# Patient Record
Sex: Male | Born: 1980 | Race: Black or African American | Hispanic: No | Marital: Single | Smoking: Current every day smoker
Health system: Southern US, Community
[De-identification: ages and names within clinical notes are randomized; demographics above are authoritative.]

## PROBLEM LIST (undated history)

## (undated) DIAGNOSIS — E119 Type 2 diabetes mellitus without complications: Secondary | ICD-10-CM

---

## 2005-03-19 ENCOUNTER — Emergency Department: Payer: Self-pay | Admitting: Emergency Medicine

## 2006-07-12 ENCOUNTER — Emergency Department: Payer: Self-pay | Admitting: Emergency Medicine

## 2006-07-13 ENCOUNTER — Other Ambulatory Visit: Payer: Self-pay

## 2006-10-09 ENCOUNTER — Emergency Department: Payer: Self-pay | Admitting: Emergency Medicine

## 2007-05-07 ENCOUNTER — Emergency Department: Payer: Self-pay | Admitting: Unknown Physician Specialty

## 2007-05-07 ENCOUNTER — Other Ambulatory Visit: Payer: Self-pay

## 2007-07-21 ENCOUNTER — Emergency Department: Payer: Self-pay | Admitting: Emergency Medicine

## 2007-07-21 ENCOUNTER — Other Ambulatory Visit: Payer: Self-pay

## 2007-09-24 ENCOUNTER — Ambulatory Visit: Payer: Self-pay | Admitting: Internal Medicine

## 2007-09-24 ENCOUNTER — Other Ambulatory Visit: Payer: Self-pay

## 2008-09-12 IMAGING — CR DG CHEST 2V
1 series · 2 of 2 positions shown · non-contrast
Comparison: none

REASON FOR EXAM: chest pain
COMMENTS:

PROCEDURE:     MDR - MDR CHEST PA(OR AP) AND LATERAL  - September 24, 2007  [DATE]
RESULT:     No acute cardiopulmonary disease is noted.

[Series 1: view not recorded · 0.17mm/px · 2 of 2 slices shown]
[im 1/2]
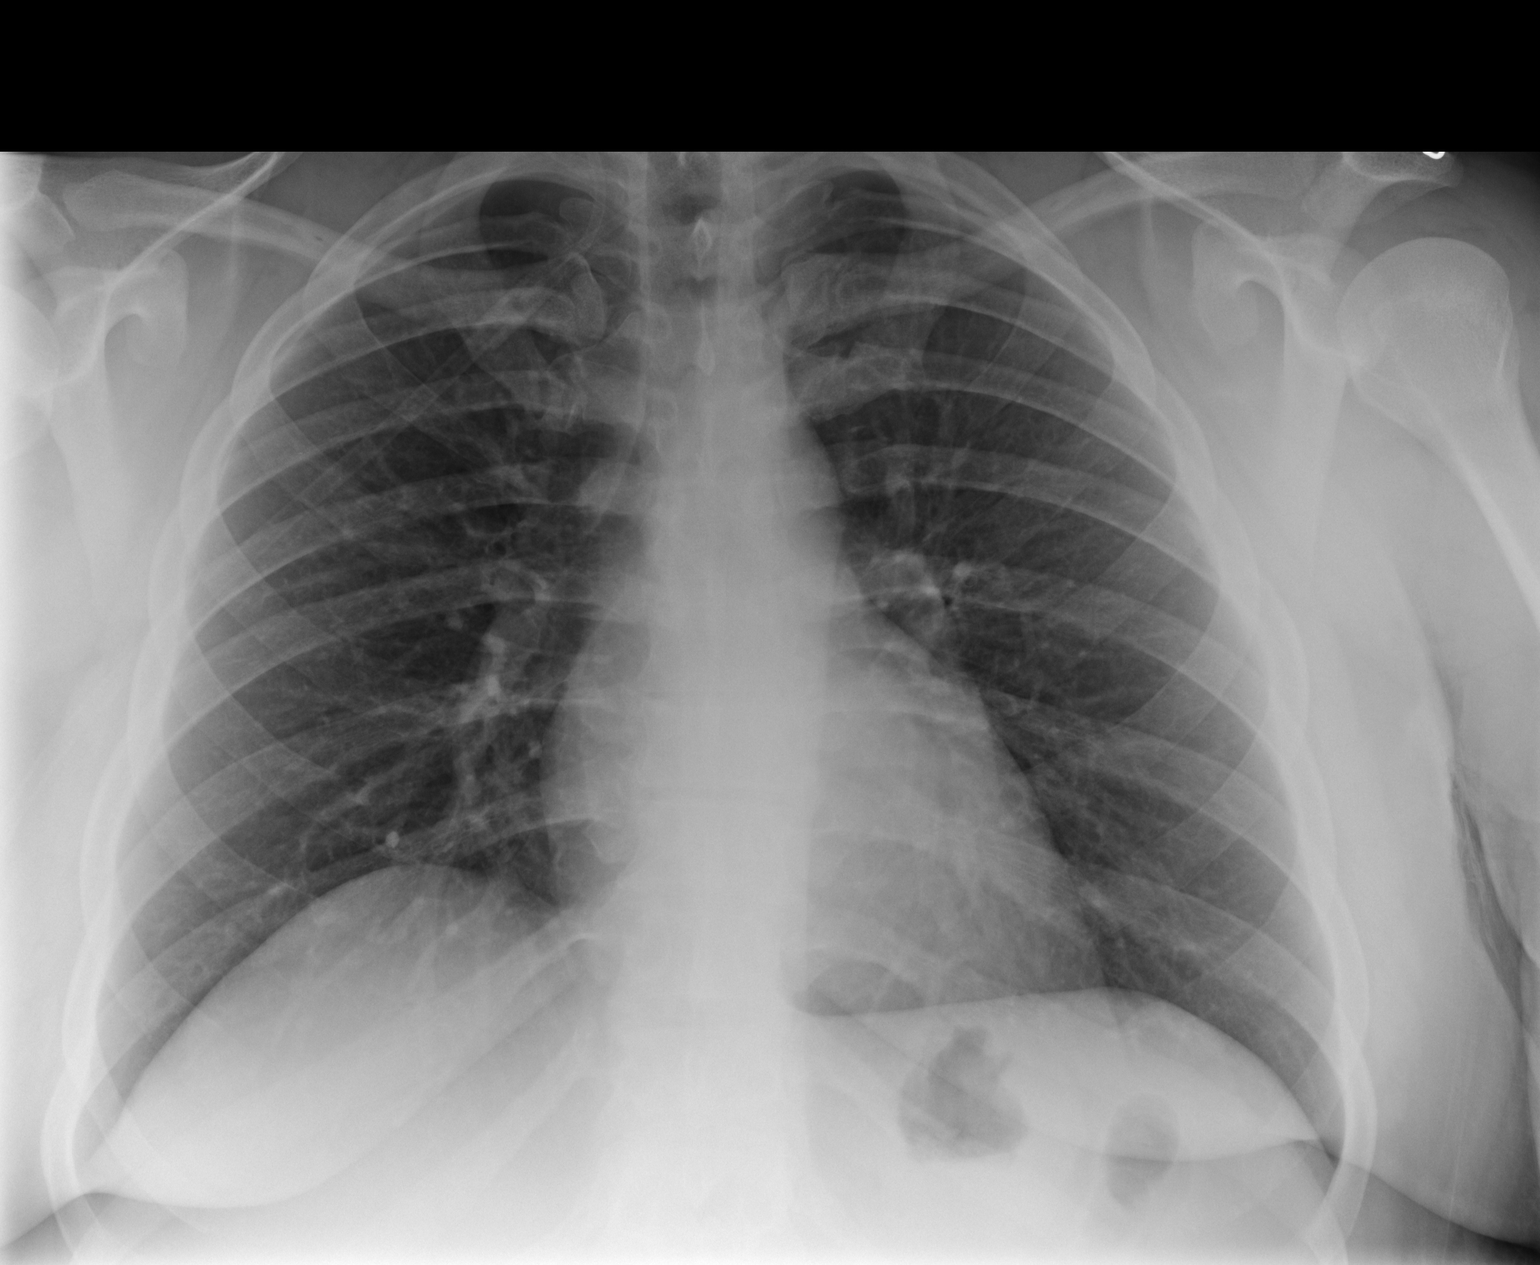
[im 2/2]
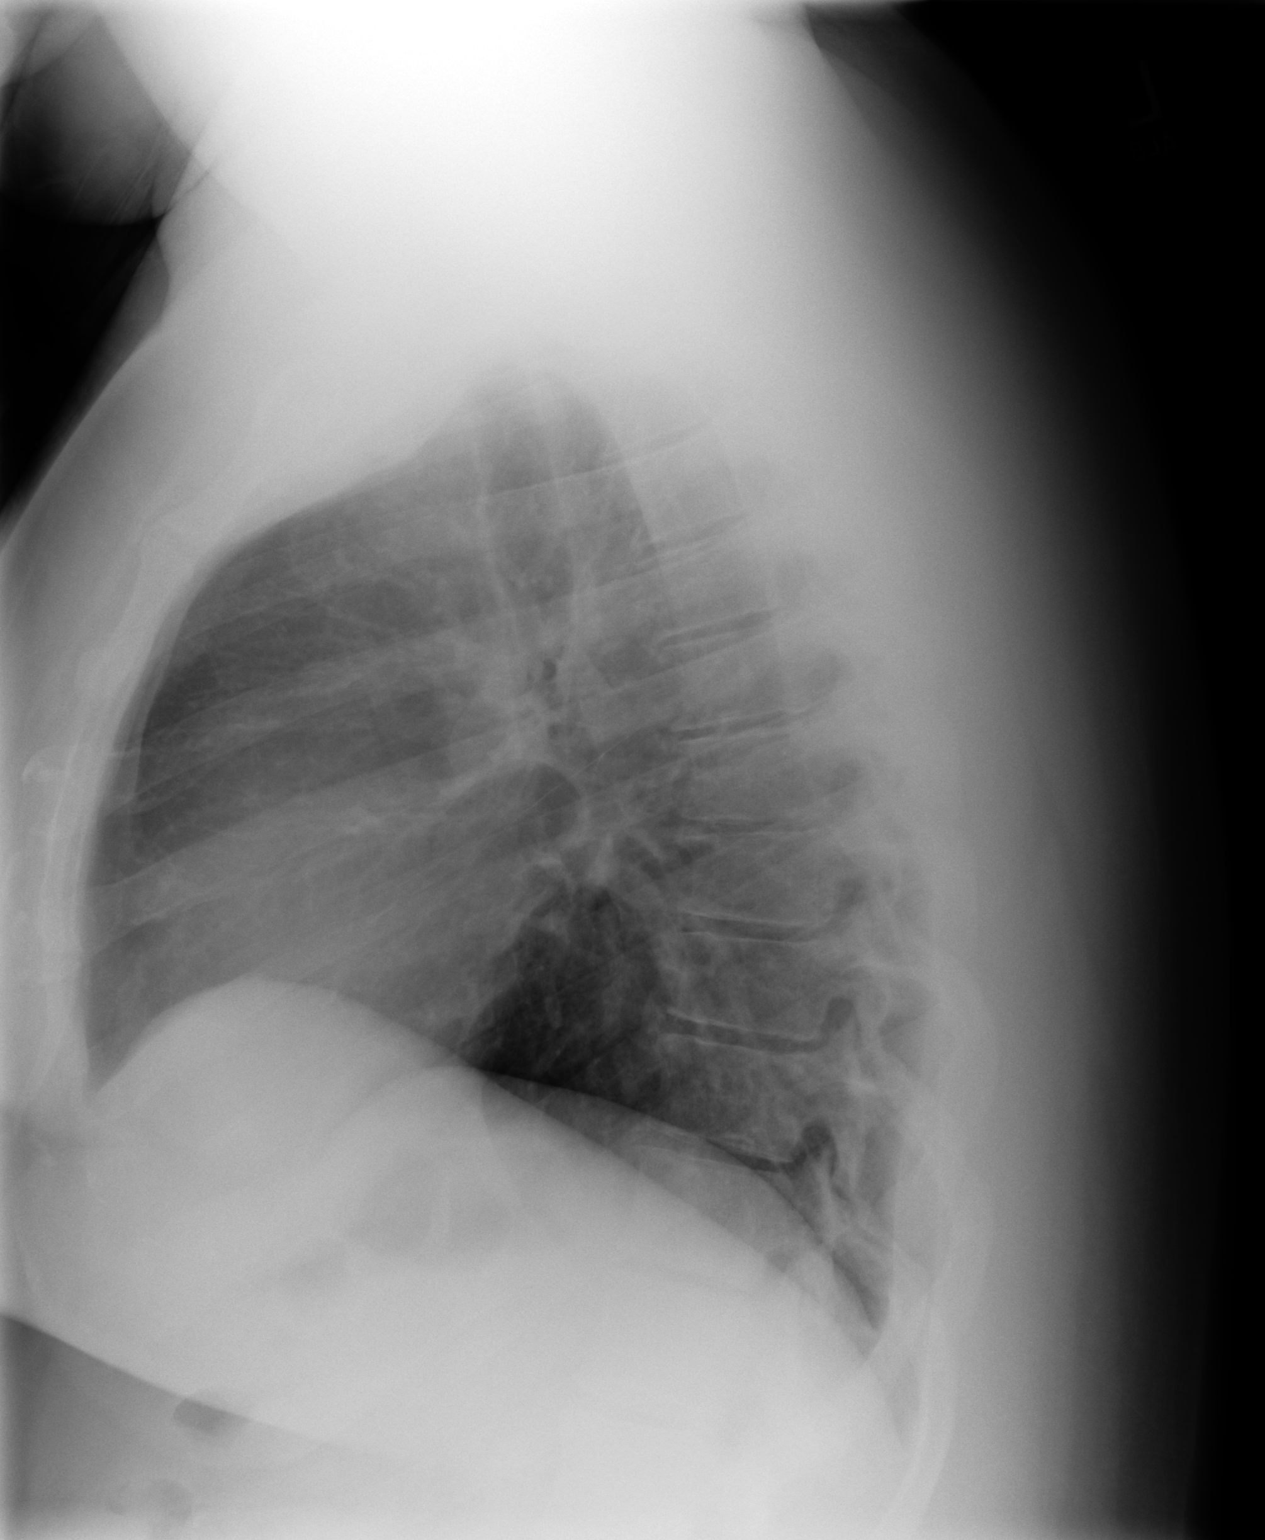

[2 of 2 positions shown; findings below may reference images not displayed]

IMPRESSION: No acute cardiopulmonary disease.

## 2008-12-27 ENCOUNTER — Emergency Department: Payer: Self-pay | Admitting: Emergency Medicine

## 2010-06-11 ENCOUNTER — Emergency Department: Payer: Self-pay | Admitting: Emergency Medicine

## 2015-02-24 ENCOUNTER — Encounter: Payer: Self-pay | Admitting: *Deleted

## 2015-02-24 ENCOUNTER — Emergency Department
Admission: EM | Admit: 2015-02-24 | Discharge: 2015-02-24 | Disposition: A | Discharge status: Home / Self Care | Payer: BLUE CROSS/BLUE SHIELD | Attending: Student | Admitting: Student

## 2015-02-24 ENCOUNTER — Emergency Department: Payer: BLUE CROSS/BLUE SHIELD

## 2015-02-24 DIAGNOSIS — M940 Chondrocostal junction syndrome [Tietze]: Secondary | ICD-10-CM | POA: Insufficient documentation

## 2015-02-24 DIAGNOSIS — R079 Chest pain, unspecified: Secondary | ICD-10-CM | POA: Diagnosis present

## 2015-02-24 LAB — CBC
HEMATOCRIT: 42.2 % (ref 40.0–52.0)
HEMOGLOBIN: 14.3 g/dL (ref 13.0–18.0)
MCH: 29.7 pg (ref 26.0–34.0)
MCHC: 33.8 g/dL (ref 32.0–36.0)
MCV: 87.9 fL (ref 80.0–100.0)
Platelets: 242 10*3/uL (ref 150–440)
RBC: 4.8 MIL/uL (ref 4.40–5.90)
RDW: 13.9 % (ref 11.5–14.5)
WBC: 7.3 10*3/uL (ref 3.8–10.6)

## 2015-02-24 LAB — BASIC METABOLIC PANEL
ANION GAP: 8 (ref 5–15)
BUN: 12 mg/dL (ref 6–20)
CHLORIDE: 103 mmol/L (ref 101–111)
CO2: 25 mmol/L (ref 22–32)
Calcium: 9.1 mg/dL (ref 8.9–10.3)
Creatinine, Ser: 1.24 mg/dL (ref 0.61–1.24)
GFR calc non Af Amer: >60 mL/min (ref >60)
Glucose, Bld: 222 mg/dL — ABNORMAL HIGH (ref 65–99)
POTASSIUM: 3.4 mmol/L — AB (ref 3.5–5.1)
SODIUM: 136 mmol/L (ref 135–145)

## 2015-02-24 LAB — FIBRIN DERIVATIVES D-DIMER (ARMC ONLY): Fibrin derivatives D-dimer (ARMC): 451 (ref 0–499)

## 2015-02-24 LAB — TROPONIN I: Troponin I: <0.03 ng/mL (ref <0.031)

## 2015-02-24 MED ORDER — KETOROLAC TROMETHAMINE 30 MG/ML IJ SOLN
30.0000 mg | Freq: Once | INTRAMUSCULAR | Status: AC
  Administered 2015-02-24: 30 mg via INTRAVENOUS
  Filled 2015-02-24: qty 1

## 2015-02-24 MED ORDER — OXYCODONE-ACETAMINOPHEN 5-325 MG PO TABS
2.0000 | ORAL_TABLET | Freq: Once | ORAL | Status: AC
  Administered 2015-02-24: 2 via ORAL
  Filled 2015-02-24: qty 2

## 2015-02-24 MED ORDER — SODIUM CHLORIDE 0.9 % IV BOLUS (SEPSIS)
1000.0000 mL | Freq: Once | INTRAVENOUS | Status: AC
  Administered 2015-02-24: 1000 mL via INTRAVENOUS

## 2015-02-24 MED ORDER — IBUPROFEN 600 MG PO TABS: 600.0000 mg | ORAL_TABLET | Freq: Three times a day (TID) | ORAL | Status: AC | PRN

## 2015-02-24 NOTE — ED Notes (Signed)
Presents via family with intermittent chest pain since last weds.

## 2015-02-24 NOTE — ED Notes (Signed)
Patient transported to X-ray 

## 2015-02-24 NOTE — ED Notes (Signed)
States right sided chest pain with SOB for 1 week, states tenderness to touch, pt states he was told he had an enlarged heart but never followed up with cardiology, states the pain gets better when he holds his chest or lies on his back

## 2015-02-24 NOTE — ED Provider Notes (Signed)
[facility] Emergency Department Provider Note  ____________________________________________  Time seen: Approximately 6:11 PM  I have reviewed the triage vital signs and the nursing notes.   HISTORY  Chief Complaint Chest Pain    HPI James Valentine is a 35 y.o. male with history of diabetes who presents for evaluation of 9 days constant right anterior chest wall pain, gradual onset, constant since onset, currently moderate, worse with deep inspiration and movement and associated with mild shortness of breath. Pain is not worse with exertion. Pain is not ripping or tearing in nature, does not radiate towards the feet or towards the back. The patient reports that initially he thought it was secondary to a pulled muscle. He has no history of coronary artery disease. He does have a family history of early coronary artery disease. No personal or family history of DVT or PE. No family history of sudden cardiac death. He has had cough but no fever.   History reviewed. No pertinent past medical history.  There are no active problems to display for this patient.   No past surgical history on file.  No current outpatient prescriptions on file.  Allergies Review of patient's allergies indicates no known allergies.  History reviewed. No pertinent family history.  Social History Social History  Substance Use Topics  . Smoking status: None  . Smokeless tobacco: None  . Alcohol Use: None    Review of Systems Constitutional: No fever/chills Eyes: No visual changes. ENT: No sore throat. Cardiovascular: + chest pain. Respiratory: + shortness of breath. Gastrointestinal: No abdominal pain.  No nausea, no vomiting.  No diarrhea.  No constipation. Genitourinary: Negative for dysuria. Musculoskeletal: Negative for back pain. Skin: Negative for rash. Neurological: Negative for headaches, focal weakness or numbness.  10-point ROS otherwise  negative.  ____________________________________________   PHYSICAL EXAM:  VITAL SIGNS: ED Triage Vitals  Enc Vitals Group     BP 02/24/15 1748 144/90 mmHg     Pulse Rate 02/24/15 1748 130     Resp 02/24/15 1748 18     Temp 02/24/15 1748 98.6 F (37 C)     Temp Source 02/24/15 1748 Oral     SpO2 02/24/15 1748 100 %     Weight 02/24/15 1748 257 lb (116.574 kg)     Height 02/24/15 1748  (1.803 m)     Head Cir --      Peak Flow --      Pain Score 02/24/15 1751 6     Pain Loc --      Pain Edu? --      Excl. in GC? --     Constitutional: Alert and oriented. Well appearing and in no acute distress. Eyes: Conjunctivae are normal. PERRL. EOMI. Head: Atraumatic. Nose: No congestion/rhinnorhea. Mouth/Throat: Mucous membranes are moist.  Oropharynx non-erythematous. Neck: No stridor.   Cardiovascular: Normal rate, regular rhythm. Grossly normal heart sounds.  Good peripheral circulation. Respiratory: Normal respiratory effort.  No retractions. Lungs CTAB. Gastrointestinal: Soft and nontender. No distention.  No CVA tenderness. Genitourinary: deferred Musculoskeletal: No lower extremity tenderness nor edema.  No joint effusions. The patient has reproducible right anterior chest wall point tenderness to palpation. Neurologic:  Normal speech and language. No gross focal neurologic deficits are appreciated. No gait instability. Skin:  Skin is warm, dry and intact. No rash noted. Psychiatric: Mood and affect are normal. Speech and behavior are normal.  __________________________________[facility] Ltd[facility]dered are listed, but only abnormal results are displayed)  Labs Reviewed  BASIC METABOLIC PANEL - Abnormal; Notable for the following:    Potassium 3.4 (*)    Glucose, Bld 222 (*)    All other components within normal limits  CBC  TROPONIN I  FIBRIN DERIVATIVES D-DIMER ([facility] ONLY)   ____________________________________________  EKG  ED ECG REPORT I,  Gayla Doss, the attending physician, personally viewed and interpreted this ECG.   Date: 02/24/2015  EKG Time: 17:46  Rate: 128  Rhythm: Sinus tachycardia  Axis: normal  Intervals:none  ST&T Change: No acute ST elevation. Q-wave in V3.  ____________________________________________  RADIOLOGY  CXR IMPRESSION: No acute cardiopulmonary disease.  ____________________________________________   PROCEDURES  Procedure(s) performed: None  Critical Care performed: No  ____________________________________________   INITIAL IMPRESSION / ASSESSMENT AND PLAN / ED COURSE  Pertinent labs & imaging results that were available during my care of the patient were reviewed by me and considered in my medical decision making (see chart for details).  James Valentine is a 35 y.o. male with history of diabetes who presents for evaluation of 9 days constant right anterior chest wall pain, gradual onset, constant since onset, currently moderate, worse with deep inspiration and movement and associated with mild shortness of breath. On exam, he is generally well-appearing and in no acute distress. He was noted to be severely tachycardic and his triage assessment however this has essentially resolved at the time of my evaluation however will give 1 L normal saline. He has reproducible chest wall tenderness to palpation suspect his pain is likely muscle skeletal in nature, possibly costochondritis. His pain and obtain screening cardiac labs as well as d-dimer. Reassess for disposition.  ----------------------------------------- 8:15 PM on 02/24/2015 -----------------------------------------  Patient with continued resolution of tachycardia. Pain slightly improved at this time. EKG not consistent with acute ischemia. Troponin negative. Heart score 2, doubt ACS. D-dimer is not elevated, doubt PE. Clinically not consistent with acute aortic dissection. Chest x-ray clear. Normal CBC and BMP. Discussed return  precautions, need for close PCP follow-up and the patient is comfortable with the discharge plan. DC home with NSAIDs. ____________________________________________   FINAL CLINICAL IMPRESSION(S) / ED DIAGNOSES  Final diagnoses:  Chest pain, unspecified chest pain type  Costochondritis, acute      Gayla Doss, MD 02/24/15 2016

## 2015-03-08 ENCOUNTER — Ambulatory Visit
Admission: EM | Admit: 2015-03-08 | Discharge: 2015-03-08 | Disposition: A | Discharge status: Home / Self Care | Payer: BLUE CROSS/BLUE SHIELD | Attending: Family Medicine | Admitting: Family Medicine

## 2015-03-08 ENCOUNTER — Encounter: Payer: Self-pay | Admitting: Emergency Medicine

## 2015-03-08 DIAGNOSIS — H6502 Acute serous otitis media, left ear: Secondary | ICD-10-CM | POA: Diagnosis not present

## 2015-03-08 HISTORY — DX: Type 2 diabetes mellitus without complications: E11.9

## 2015-03-08 LAB — RAPID STREP SCREEN (MED CTR MEBANE ONLY): Streptococcus, Group A Screen (Direct): NEGATIVE

## 2015-03-08 MED ORDER — AMOXICILLIN 875 MG PO TABS
875.0000 mg | ORAL_TABLET | Freq: Two times a day (BID) | ORAL | Status: AC
Start: 2015-03-08 — End: ?

## 2015-03-08 NOTE — ED Notes (Signed)
Patient c/o pain in his left ear and sore throat that started on Friday.  Patient denies fevers.

## 2015-03-08 NOTE — ED Provider Notes (Addendum)
CSN: [id number]     Arrival date & time 03/08/15  1800 History   First MD Initiated Contact with Patient 03/08/15 1847     Chief Complaint  Patient presents with  . Otalgia  . Sore Throat   (Consider location/radiation/quality/duration/timing/severity/associated sxs/prior Treatment) Patient is a 35 y.o. male presenting with ear pain and pharyngitis.  Otalgia Location:  Left Quality:  Aching and pressure Severity:  Moderate Onset quality:  Sudden Duration:  3 days Timing:  Constant Progression:  Worsening Chronicity:  New Relieved by:  None tried Ineffective treatments:  None tried Associated symptoms: congestion, cough and rhinorrhea   Associated symptoms: no abdominal pain, no ear discharge, no fever, no neck pain and no rash   Risk factors: no recent travel, no chronic ear infection and no prior ear surgery   Sore Throat Pertinent negatives include no abdominal pain.    Past Medical History  Diagnosis Date  . Diabetes mellitus without complication (HCC)    History reviewed. No pertinent past surgical history. History reviewed. No pertinent family history. Social History  Substance Use Topics  . Smoking status: Current Every Day Smoker    Types: Cigarettes  . Smokeless tobacco: None  . Alcohol Use: No    Review of Systems  Constitutional: Negative for fever.  HENT: Positive for congestion, ear pain and rhinorrhea. Negative for ear discharge.   Respiratory: Positive for cough.   Gastrointestinal: Negative for abdominal pain.  Musculoskeletal: Negative for neck pain.  Skin: Negative for rash.    Allergies  Review of patient's allergies indicates no known allergies.  Home Medications   Prior to Admission medications   Medication Sig Start Date End Date Taking? Authorizing Provider  amoxicillin (AMOXIL) 875 MG tablet Take 1 tablet (875 mg total) by mouth 2 (two) times daily. 03/08/15   Payton Mccallum, MD  ibuprofen (ADVIL,MOTRIN) 600 MG tablet Take 1 tablet (600  mg total) by mouth every 8 (eight) hours as needed for moderate pain. 02/24/15   Gayla Doss, MD   Meds Ordered and Administered this Visit  Medications - No data to display  BP 160/86 mmHg  Pulse 92  Temp(Src) 97.9 F (36.6 C) (Tympanic)  Resp 16  Ht 6' (1.829 m)  Wt 250 lb (113.399 kg)  BMI 33.90 kg/m2  SpO2 98% No data found.   Physical Exam  Constitutional: He appears well-developed and well-nourished. No distress.  HENT:  Head: Normocephalic and atraumatic.  Right Ear: Tympanic membrane, external ear and ear canal normal.  Left Ear: External ear and ear canal normal. Tympanic membrane is erythematous and bulging. A middle ear effusion is present.  Nose: Rhinorrhea present.  Mouth/Throat: Uvula is midline and mucous membranes are normal. Posterior oropharyngeal erythema present. No oropharyngeal exudate, posterior oropharyngeal edema or tonsillar abscesses.  Eyes: Conjunctivae and EOM are normal. Pupils are equal, round, and reactive to light. Right eye exhibits no discharge. Left eye exhibits no discharge. No scleral icterus.  Neck: Normal range of motion. Neck supple. No tracheal deviation present. No thyromegaly present.  Cardiovascular: Normal rate, regular rhythm and normal heart sounds.   Pulmonary/Chest: Effort normal and breath sounds normal. No stridor. No respiratory distress. He has no wheezes. He has no rales. He exhibits no tenderness.  Lymphadenopathy:    He has no cervical adenopathy.  Neurological: He is alert.  Skin: Skin is warm and dry. No rash noted. He is not diaphoretic.  Nursing note and vitals reviewed.   ED Course  Procedures (including critical  care time)  Labs Review Labs Reviewed  RAPID STREP SCREEN (NOT AT [facility])  CULTURE, GROUP A STREP [facility])    Imaging Review No results found.   Visual Acuity Review  Right Eye Distance:   Left Eye Distance:   Bilateral Distance:    Right Eye Near:   Left Eye Near:    Bilateral Near:          MDM   1. Acute serous otitis media of left ear, recurrence not specified    Discharge Medication List as of 03/08/2015  6:55 PM    START taking these medications   Details  amoxicillin (AMOXIL) 875 MG tablet Take 1 tablet (875 mg total) by mouth 2 (two) times daily., Starting 03/08/2015, Until Discontinued, Normal       1. diagnosis reviewed with patient 2. rx as per orders above; reviewed possible side effects, interactions, risks and benefits  3. Recommend supportive treatment with  4. Follow-up prn if symptoms worsen or don't improve    Payton Mccallum, MD 03/08/15 1858  Payton Mccallum, MD 03/08/15 1859

## 2015-03-12 ENCOUNTER — Telehealth: Payer: Self-pay

## 2015-03-12 LAB — CULTURE, GROUP A STREP (THRC)

## 2015-03-12 NOTE — ED Notes (Signed)
Called patient to advise + Group C result throat culture. No answer. Instructed to call and let MUC know if improving. Rx for Amoxicillin given day patient seen here

## 2015-04-05 ENCOUNTER — Ambulatory Visit
Admission: EM | Admit: 2015-04-05 | Discharge: 2015-04-05 | Disposition: A | Discharge status: Home / Self Care | Payer: BLUE CROSS/BLUE SHIELD | Attending: Family Medicine | Admitting: Family Medicine

## 2015-04-05 ENCOUNTER — Encounter: Payer: Self-pay | Admitting: Emergency Medicine

## 2015-04-05 ENCOUNTER — Ambulatory Visit (INDEPENDENT_AMBULATORY_CARE_PROVIDER_SITE_OTHER): Payer: BLUE CROSS/BLUE SHIELD

## 2015-04-05 DIAGNOSIS — H6982 Other specified disorders of Eustachian tube, left ear: Secondary | ICD-10-CM

## 2015-04-05 DIAGNOSIS — J0100 Acute maxillary sinusitis, unspecified: Secondary | ICD-10-CM | POA: Diagnosis not present

## 2015-04-05 DIAGNOSIS — F17210 Nicotine dependence, cigarettes, uncomplicated: Secondary | ICD-10-CM | POA: Insufficient documentation

## 2015-04-05 DIAGNOSIS — Z716 Tobacco abuse counseling: Secondary | ICD-10-CM | POA: Diagnosis not present

## 2015-04-05 DIAGNOSIS — H9202 Otalgia, left ear: Secondary | ICD-10-CM | POA: Diagnosis present

## 2015-04-05 DIAGNOSIS — J209 Acute bronchitis, unspecified: Secondary | ICD-10-CM

## 2015-04-05 DIAGNOSIS — E119 Type 2 diabetes mellitus without complications: Secondary | ICD-10-CM | POA: Diagnosis not present

## 2015-04-05 LAB — RAPID INFLUENZA A&B ANTIGENS (ARMC ONLY)
INFLUENZA A (ARMC): NEGATIVE
INFLUENZA B (ARMC): NEGATIVE

## 2015-04-05 MED ORDER — HYDROCOD POLST-CPM POLST ER 10-8 MG/5ML PO SUER: 5.0000 mL | Freq: Two times a day (BID) | ORAL | Status: AC | PRN

## 2015-04-05 MED ORDER — ALBUTEROL SULFATE HFA 108 (90 BASE) MCG/ACT IN AERS: 2.0000 | INHALATION_SPRAY | RESPIRATORY_TRACT | Status: AC | PRN

## 2015-04-05 MED ORDER — FEXOFENADINE-PSEUDOEPHED ER 180-240 MG PO TB24: 1.0000 | ORAL_TABLET | Freq: Every day | ORAL | Status: AC

## 2015-04-05 MED ORDER — LEVOFLOXACIN 500 MG PO TABS: 500.0000 mg | ORAL_TABLET | Freq: Every day | ORAL | Status: AC

## 2015-04-05 NOTE — Discharge Instructions (Signed)
Acute Bronchitis °Bronchitis is when the airways that extend from the windpipe into the lungs get red, puffy, and painful (inflamed). Bronchitis often causes thick spit (mucus) to develop. This leads to a cough. A cough is the most common symptom of bronchitis. °In acute bronchitis, the condition usually begins suddenly and goes away over time (usually in 2 weeks). Smoking, allergies, and asthma can make bronchitis worse. Repeated episodes of bronchitis may cause more lung problems. °HOME CARE °· Rest. °· Drink enough fluids to keep your pee (urine) clear or pale yellow (unless you need to limit fluids as told by your doctor). °· Only take over-the-counter or prescription medicines as told by your doctor. °· Avoid smoking and secondhand smoke. These can make bronchitis worse. If you are a smoker, think about using nicotine gum or skin patches. Quitting smoking will help your lungs heal faster. °· Reduce the chance of getting bronchitis again by: °¨ Washing your hands often. °¨ Avoiding people with cold symptoms. °¨ Trying not to touch your hands to your mouth, nose, or eyes. °· Follow up with your doctor as told. °GET HELP IF: °Your symptoms do not improve after 1 week of treatment. Symptoms include: °· Cough. °· Fever. °· Coughing up thick spit. °· Body aches. °· Chest congestion. °· Chills. °· Shortness of breath. °· Sore throat. °GET HELP RIGHT AWAY IF:  °· You have an increased fever. °· You have chills. °· You have severe shortness of breath. °· You have bloody thick spit (sputum). °· You throw up (vomit) often. °· You lose too much body fluid (dehydration). °· You have a severe headache. °· You faint. °MAKE SURE YOU:  °· Understand these instructions. °· Will watch your condition. °· Will get help right away if you are not doing well or get worse. °  °This information is not intended to replace advice given to you by your health care provider. Make sure you discuss any questions you have with your health care  provider. °  °Document Released: 06/27/2007 Document Revised: 09/10/2012 Document Reviewed: 07/01/2012 °Elsevier Interactive Patient Education ©2016 [employer] ° °Sinusitis, Adult °Sinusitis is redness, soreness, and puffiness (inflammation) of the air pockets in the bones of your face (sinuses). The redness, soreness, and puffiness can cause air and mucus to get trapped in your sinuses. This can allow germs to grow and cause an infection.  °HOME CARE  °· Drink enough fluids to keep your pee (urine) clear or pale yellow. °· Use a humidifier in your home. °· Run a hot shower to create steam in the bathroom. Sit in the bathroom with the door closed. Breathe in the steam 3-4 times a day. °· Put a warm, moist washcloth on your face 3-4 times a day, or as told by your doctor. °· Use salt water sprays (saline sprays) to wet the thick fluid in your nose. This can help the sinuses drain. °· Only take medicine as told by your doctor. °GET HELP RIGHT AWAY IF:  °· Your pain gets worse. °· You have very bad headaches. °· You are sick to your stomach (nauseous). °· You throw up (vomit). °· You are very sleepy (drowsy) all the time. °· Your face is puffy (swollen). °· Your vision changes. °· You have a stiff neck. °· You have trouble breathing. °MAKE SURE YOU:  °· Understand these instructions. °· Will watch your condition. °· Will get help right away if you are not doing well or get worse. °  °This information is   not intended to replace advice given to you by your health care provider. Make sure you discuss any questions you have with your health care provider. °  °Document Released: 06/27/2007 Document Revised: 01/29/2014 Document Reviewed: 08/14/2011 °Elsevier Interactive Patient Education ©2016 [employer] ° °Upper Respiratory Infection, Adult °Most upper respiratory infections (URIs) are caused by a virus. A URI affects the nose, throat, and upper air passages. The most common type of URI is often called "the common  cold." °HOME CARE  °· Take medicines only as told by your doctor. °· Gargle warm saltwater or take cough drops to comfort your throat as told by your doctor. °· Use a warm mist humidifier or inhale steam from a shower to increase air moisture. This may make it easier to breathe. °· Drink enough fluid to keep your pee (urine) clear or pale yellow. °· Eat soups and other clear broths. °· Have a healthy diet. °· Rest as needed. °· Go back to work when your fever is gone or your doctor says it is okay. °¨ You may need to stay home longer to avoid giving your URI to others. °¨ You can also wear a face mask and wash your hands often to prevent spread of the virus. °· Use your inhaler more if you have asthma. °· Do not use any tobacco products, including cigarettes, chewing tobacco, or electronic cigarettes. If you need help quitting, ask your doctor. °GET HELP IF: °· You are getting worse, not better. °· Your symptoms are not helped by medicine. °· You have chills. °· You are getting more short of breath. °· You have brown or red mucus. °· You have yellow or brown discharge from your nose. °· You have pain in your face, especially when you bend forward. °· You have a fever. °· You have puffy (swollen) neck glands. °· You have pain while swallowing. °· You have white areas in the back of your throat. °GET HELP RIGHT AWAY IF:  °· You have very bad or constant: °¨ Headache. °¨ Ear pain. °¨ Pain in your forehead, behind your eyes, and over your cheekbones (sinus pain). °¨ Chest pain. °· You have long-lasting (chronic) lung disease and any of the following: °¨ Wheezing. °¨ Long-lasting cough. °¨ Coughing up blood. °¨ A change in your usual mucus. °· You have a stiff neck. °· You have changes in your: °¨ Vision. °¨ Hearing. °¨ Thinking. °¨ Mood. °MAKE SURE YOU:  °· Understand these instructions. °· Will watch your condition. °· Will get help right away if you are not doing well or get worse. °  °This information is not intended  to replace advice given to you by your health care provider. Make sure you discuss any questions you have with your health care provider. °  °Document Released: 06/27/2007 Document Revised: 05/25/2014 Document Reviewed: 04/15/2013 °Elsevier Interactive Patient Education ©2016 [employer] ° °

## 2015-04-05 NOTE — ED Notes (Signed)
Patient c/o SOB, runny nose, cough, and left ear pain for 3-4 days.

## 2015-04-05 NOTE — ED Provider Notes (Signed)
CSN: [id number]     Arrival date & time 04/05/15  1753 History   First MD Initiated Contact with Patient 04/05/15 2000      Nurses notes were reviewed. Chief Complaint  Patient presents with  . Otalgia   Patient is here for multiple complaints initially comes in complaining of pain in his left ear. After further discussion he states that he's had an ear infection in his left ear he was seen by Dr. Izell Big [place]aver about a month ago placed on amoxicillin amoxicillin did not seem to cure him of his ear infection. He continued to have pressure behind his ears he's had coughed or malaise. He's been seen in the ED about a month ago because of the nonspecific chest discomfort and cough that he was having. According to his as so chest x-ray was not negative and a diagnosed with nonspecific chest discomfort. His been on that one round of antibiotics amoxicillin. About 3 days ago started having fever pain in his ears increased congestion and increased difficulty breathing. He also reports He reports most of the discomfort is on the right side of his chest and on the left doesn't radiate. Unfortunately he does smoke. He is a diabetic. No significant family medical history pertinent to this visit.    (Consider location/radiation/quality/duration/timing/severity/associated sxs/prior Treatment) Patient is a 35 y.o. male presenting with ear pain. The history is provided by the patient and the spouse. No language interpreter was used.  Otalgia Location:  Left Severity:  Moderate Onset quality:  Sudden Progression:  Worsening Chronicity:  New Context: not direct blow and not elevation change   Relieved by:  OTC medications Worsened by:  Coughing Associated symptoms: congestion, cough, fever, neck pain and rhinorrhea   Risk factors: chronic ear infection     Past Medical History  Diagnosis Date  . Diabetes mellitus without complication (HCC)    History reviewed. No pertinent past surgical history. History  reviewed. No pertinent family history. Social History  Substance Use Topics  . Smoking status: Current Every Day Smoker    Types: Cigarettes  . Smokeless tobacco: None  . Alcohol Use: No    Review of Systems  Constitutional: Positive for fever and fatigue.  HENT: Positive for congestion, ear pain, rhinorrhea and sinus pressure.   Respiratory: Positive for cough, chest tightness and shortness of breath.   Musculoskeletal: Positive for neck pain.  Skin:       Was having a lump on the right side of his neck  All other systems reviewed and are negative.   Allergies  Review of patient's allergies indicates no known allergies.  Home Medications   Prior to Admission medications   Medication Sig Start Date End Date Taking? Authorizing Provider  albuterol (PROVENTIL HFA;VENTOLIN HFA) 108 (90 Base) MCG/ACT inhaler Inhale 2 puffs into the lungs every 4 (four) hours as needed. 04/05/15   Hassan Rowan, MD  amoxicillin (AMOXIL) 875 MG tablet Take 1 tablet (875 mg total) by mouth 2 (two) times daily. 03/08/15   Payton Mccallum, MD  chlorpheniramine-HYDROcodone (TUSSIONEX PENNKINETIC ER) 10-8 MG/5ML SUER Take 5 mLs by mouth every 12 (twelve) hours as needed for cough. 04/05/15   Hassan Rowan, MD  fexofenadine-pseudoephedrine (ALLEGRA-D ALLERGY & CONGESTION) 180-240 MG 24 hr tablet Take 1 tablet by mouth daily. 04/05/15   Hassan Rowan, MD  ibuprofen (ADVIL,MOTRIN) 600 MG tablet Take 1 tablet (600 mg total) by mouth every 8 (eight) hours as needed for moderate pain. 02/24/15   Gayla Doss, MD  levofloxacin (LEVAQUIN) 500 MG tablet Take 1 tablet (500 mg total) by mouth daily. 04/05/15   Hassan Rowan, MD   Meds Ordered and Administered this Visit  Medications - No data to display  BP 130/87 mmHg  Pulse 88  Temp(Src) 97 F (36.1 C) (Tympanic)  Resp 17  Ht  (1.803 m)  Wt 249 lb (112.946 kg)  BMI 34.74 kg/m2  SpO2 100% No data found.   Physical Exam  Constitutional: He is oriented to person,  place, and time. He appears well-developed and well-nourished.  HENT:  Head: Normocephalic and atraumatic.  Right Ear: Hearing, tympanic membrane, external ear and ear canal normal.  Left Ear: Hearing, tympanic membrane, external ear and ear canal normal.  Nose: Mucosal edema and rhinorrhea present. Right sinus exhibits maxillary sinus tenderness. Left sinus exhibits maxillary sinus tenderness.  Mouth/Throat: No oral lesions. No dental abscesses. Posterior oropharyngeal erythema present.  Eyes: Conjunctivae are normal. Pupils are equal, round, and reactive to light.  Neck: Normal range of motion. Neck supple.  Tenderness over the cervical neck question lipoma felt on the right lower neck  Cardiovascular: Normal rate, regular rhythm and normal heart sounds.   No murmur heard. Pulmonary/Chest: Effort normal and breath sounds normal. No respiratory distress. He has no wheezes.  Musculoskeletal: Normal range of motion.  Lymphadenopathy:    He has cervical adenopathy.  Neurological: He is alert and oriented to person, place, and time. He has normal reflexes. No cranial nerve deficit.  Skin: Skin is warm and dry.  Psychiatric: He has a normal mood and affect.  Vitals reviewed.   ED Course  Procedures (including critical care time)  Labs Review Labs Reviewed  RAPID INFLUENZA A&B ANTIGENS [facility] ONLY)    Imaging Review Dg Chest 2 View  04/05/2015  CLINICAL DATA:  Right chest pain and cough EXAM: CHEST  2 VIEW COMPARISON:  02/24/2015 FINDINGS: Normal heart size and mediastinal contours. No acute infiltrate or edema. No effusion or pneumothorax. Thoracic spondylosis. Stable unremarkable orientation of lap band. IMPRESSION: Negative chest. Electronically Signed   By: Marnee Spring M.D.   On: 04/05/2015 21:02     Visual Acuity Review  Right Eye Distance:   Left Eye Distance:   Bilateral Distance:    Right Eye Near:   Left Eye Near:    Bilateral Near:     Results for orders placed  or performed during the hospital encounter of 04/05/15  Rapid Influenza A&B Antigens ([facility] only)  Result Value Ref Range   Influenza A ([facility]) NEGATIVE NEGATIVE   Influenza B ([facility]) NEGATIVE NEGATIVE      MDM   1. Acute maxillary sinusitis, recurrence not specified   2. Acute bronchitis, unspecified organism   3. Tobacco abuse counseling   4. Chronic eustachian tube dysfunction, left    Patient want any stop smoking will place on Tussionex 1 teaspoon twice a day will place on Allegra-D Levaquin 500 mg 1 tablet daily and will give him albuterol inhaler to use for bronchospasms should be noted chest x-ray was negative.  ED ECG REPORT I, Tashon Capp H, the attending physician, personally viewed and interpreted this ECG.   Date: 04/05/2015  EKG Time: 19:43:02  Rate: 76  Rhythm: there are no previous tracings available for comparison, normal sinus rhythm  Axis: 32  Intervals:none  ST&T Change: No specific ST changes Q waves with as ways the questionable anterior infarct does appear to be new or acute  An EKG was finally found that  [facility] had [facility]. This EKG showed nothing if anything improvement from in comparison to the last EKG.  Note: This dictation was prepared with Dragon dictation along with smaller phrase technology. Any transcriptional errors that result from this process are unintentional.  Hassan RowanEugene Kilea Mccarey, MD 04/05/15 2137

## 2016-03-24 IMAGING — CR DG CHEST 2V
3 series · 3 of 3 positions shown · non-contrast
Comparison: 02/24/2015

CLINICAL DATA: Right chest pain and cough

EXAM:
CHEST  2 VIEW

[chest pa (1 of 2)]
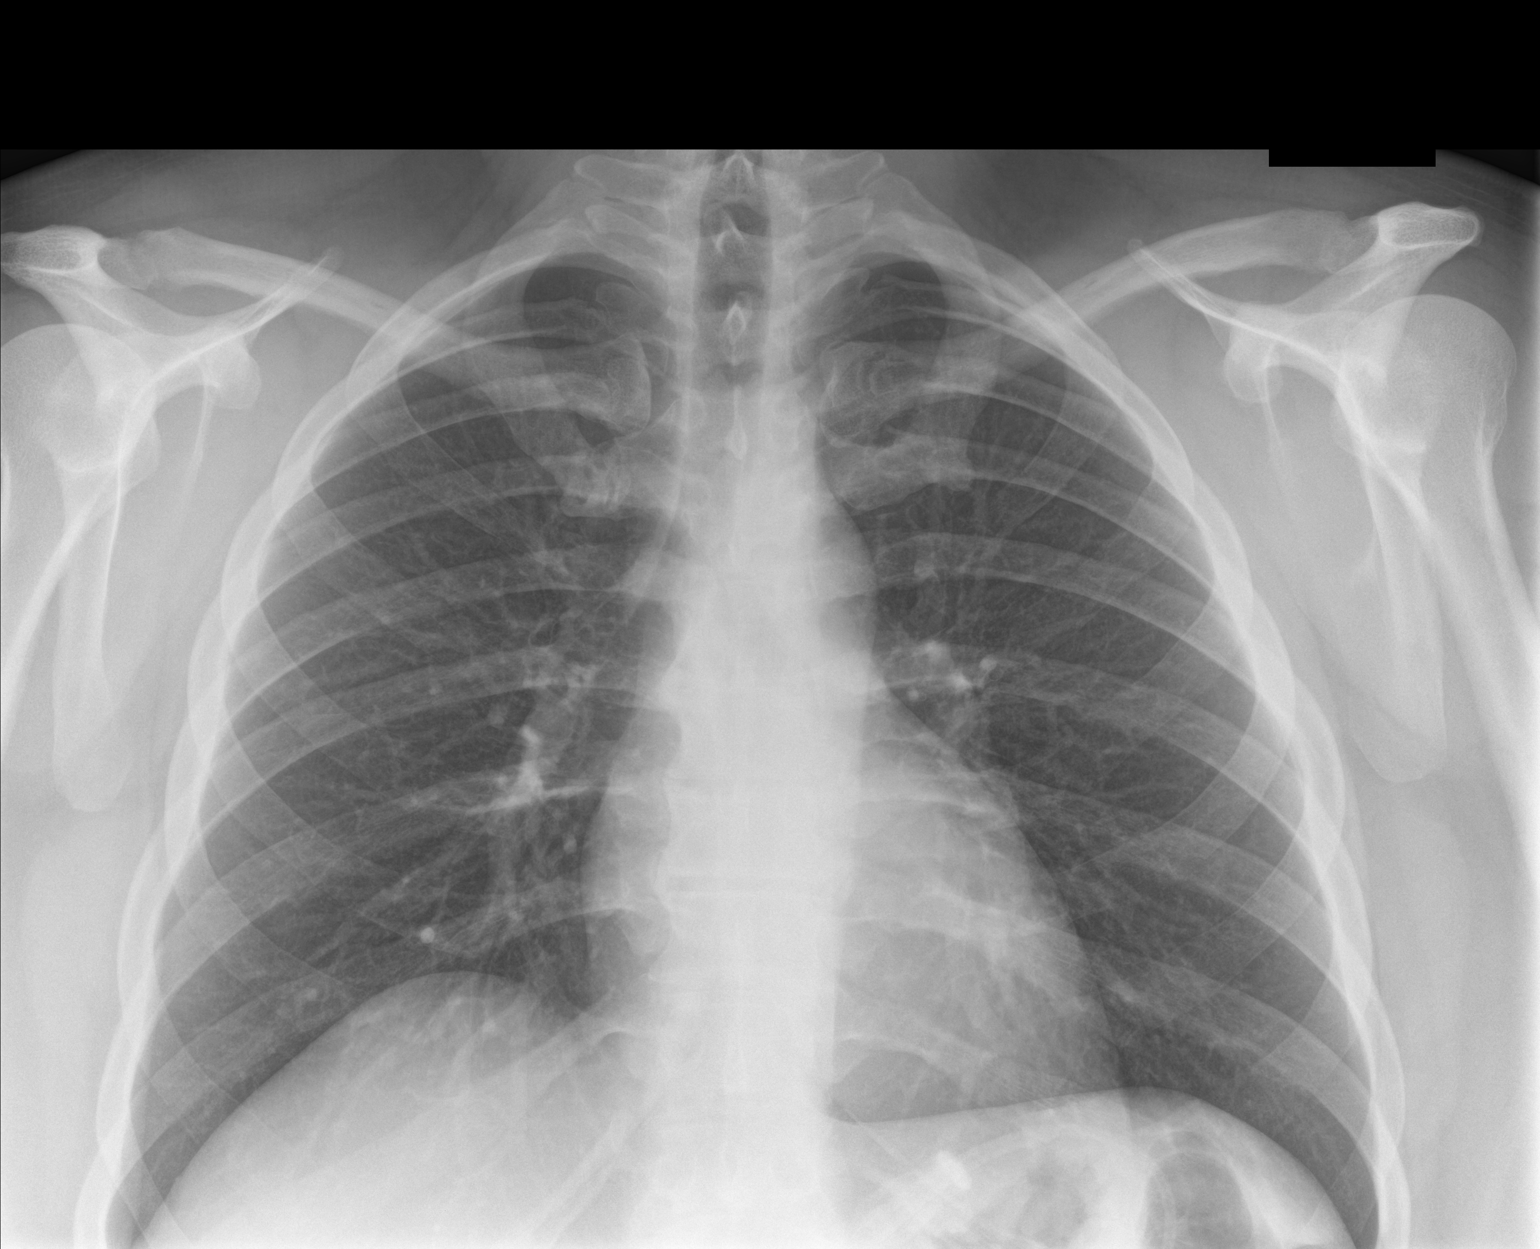

[chest lat]
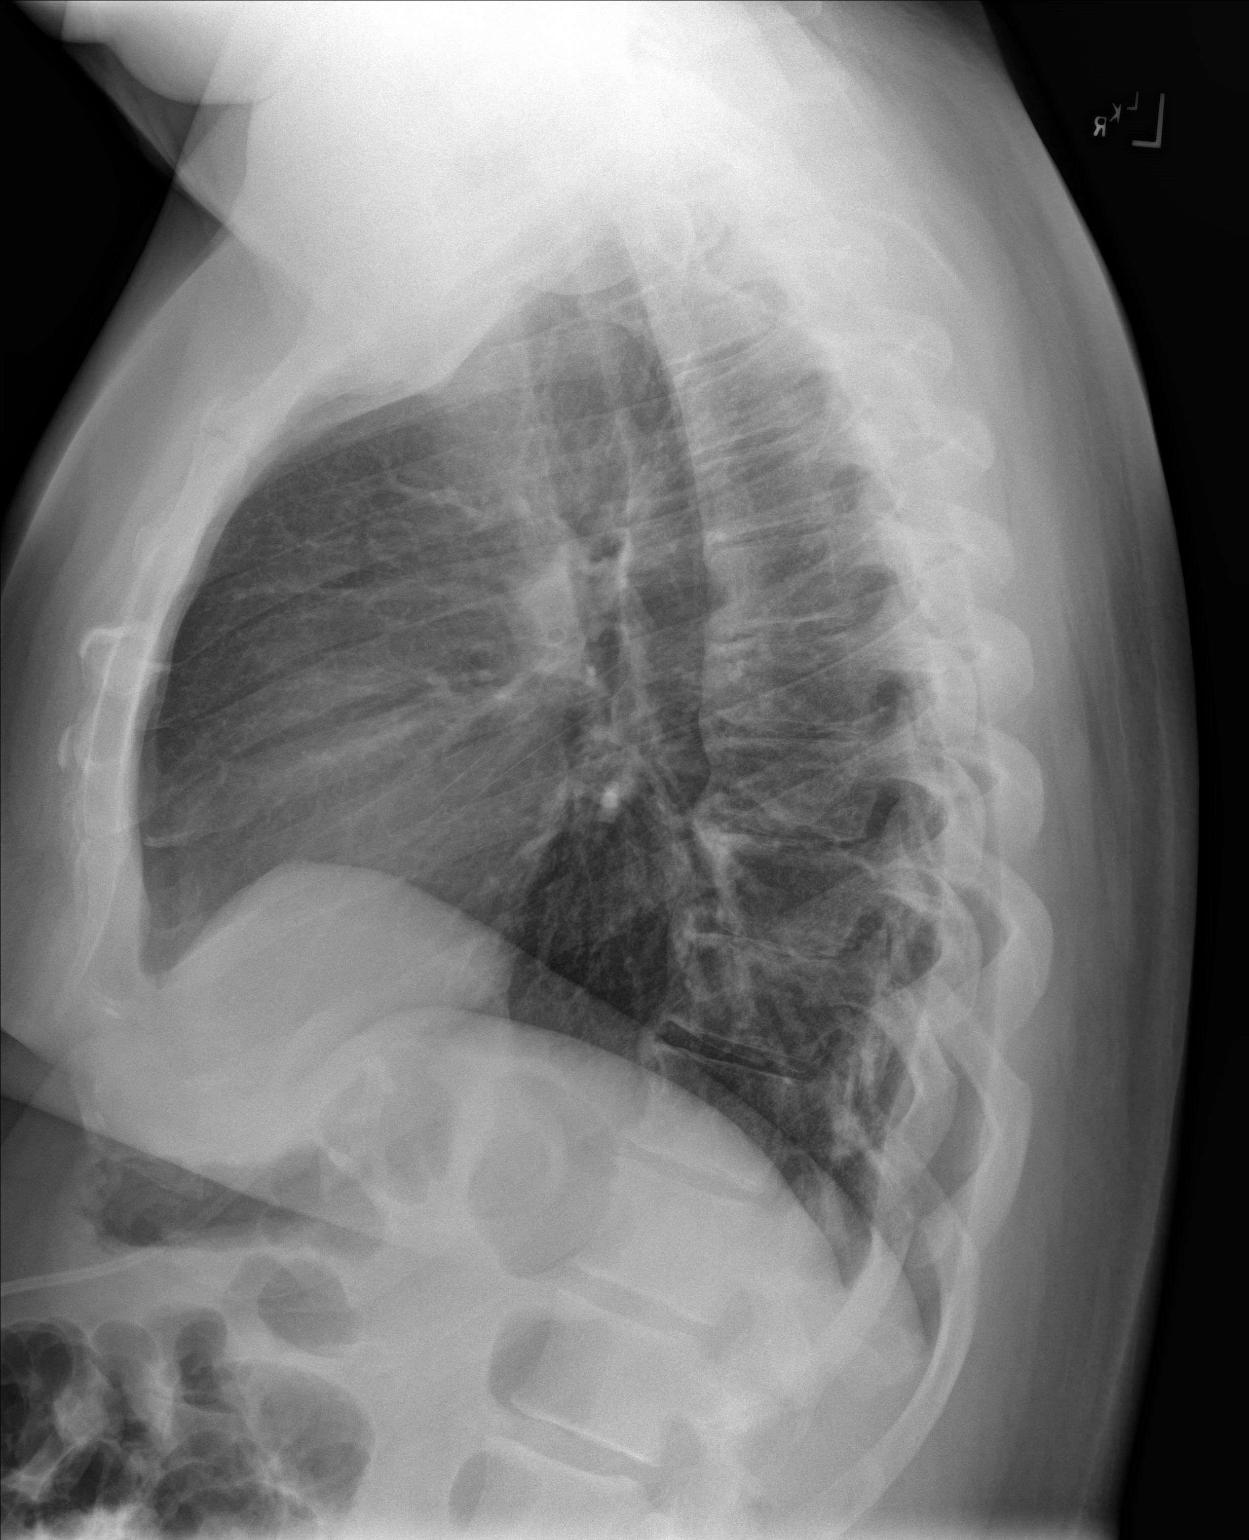

[chest pa (2 of 2)]
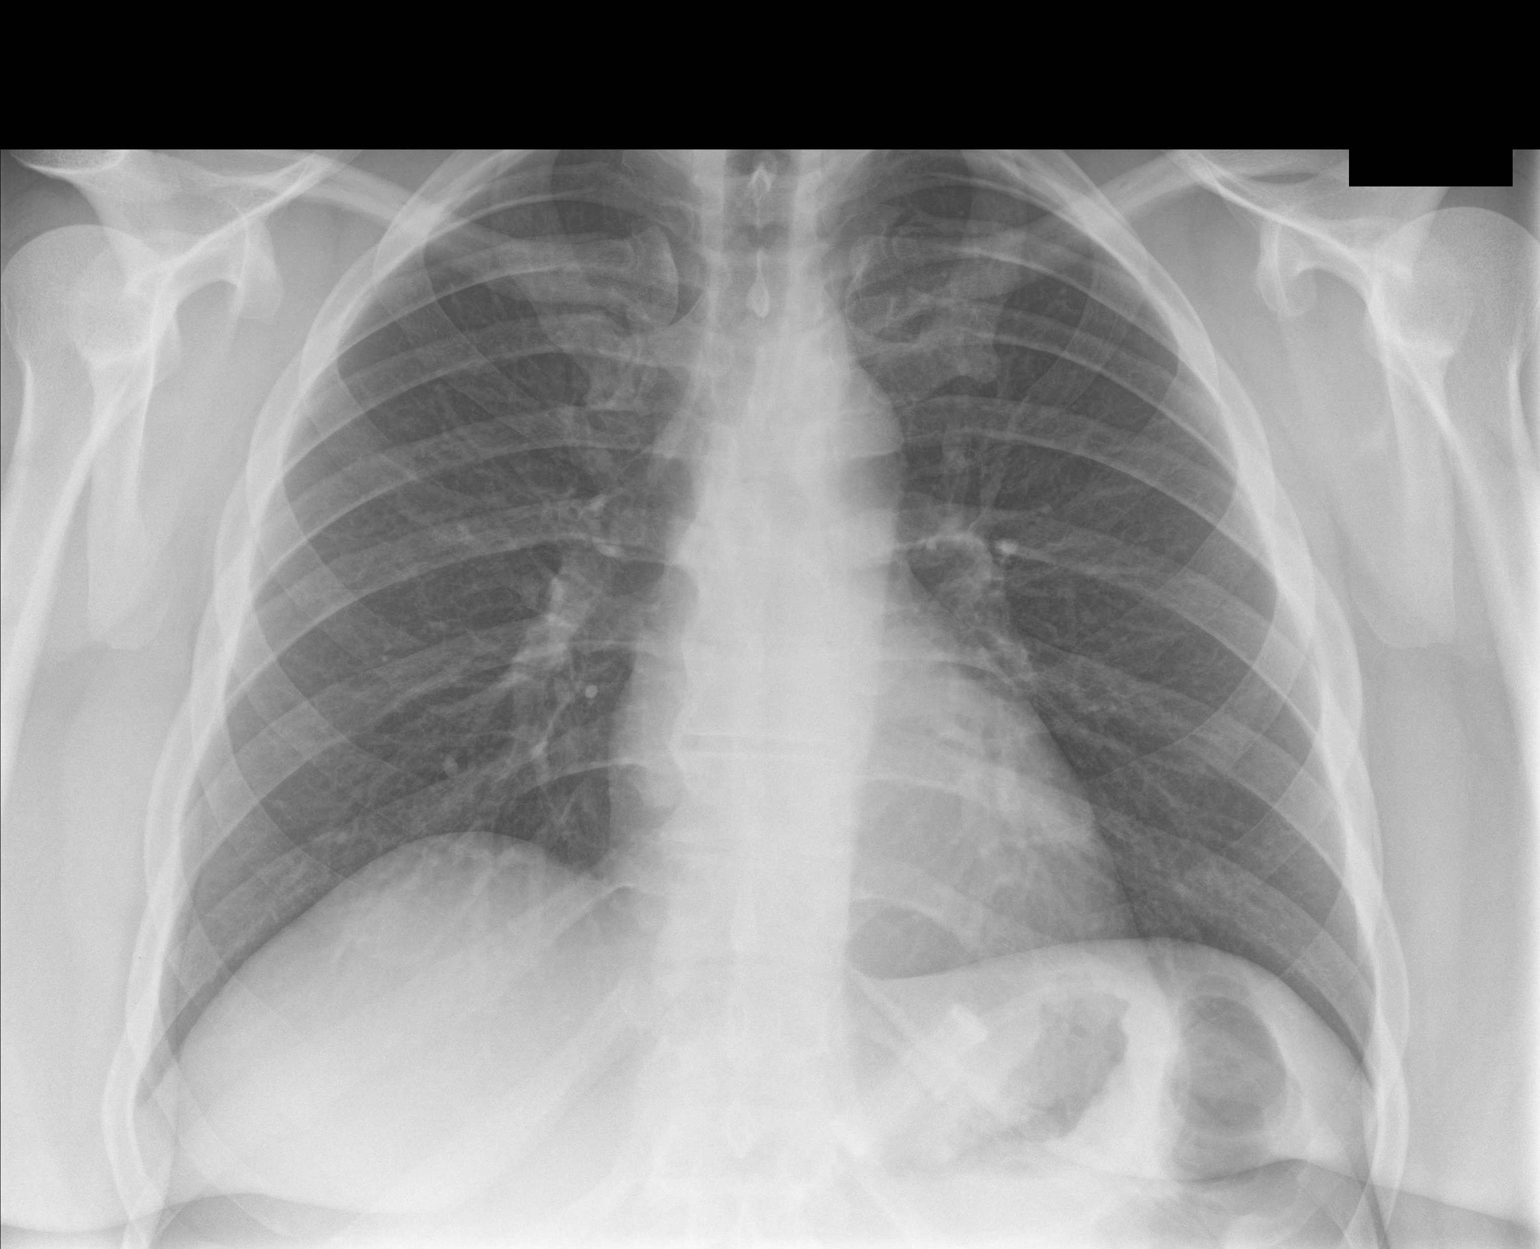

[3 of 3 positions shown; findings below may reference images not displayed]

FINDINGS: Normal heart size and mediastinal contours. No acute infiltrate or
edema. No effusion or pneumothorax. Thoracic spondylosis. Stable
unremarkable orientation of lap band.
IMPRESSION: Negative chest.
